# Patient Record
Sex: Male | Born: 1990 | Race: Black or African American | Hispanic: No | Marital: Married | Smoking: Current every day smoker
Health system: Southern US, Community
[De-identification: ages and names within clinical notes are randomized; demographics above are authoritative.]

## PROBLEM LIST (undated history)

## (undated) ENCOUNTER — Emergency Department (HOSPITAL_COMMUNITY)
Admission: EM | Discharge status: Absent without leave | Payer: No Typology Code available for payment source | Source: Home / Self Care

---

## 2017-06-01 ENCOUNTER — Emergency Department (HOSPITAL_COMMUNITY)
Admission: EM | Admit: 2017-06-01 | Discharge: 2017-06-01 | Disposition: A | Discharge status: Home / Self Care | Payer: Self-pay | Attending: Emergency Medicine | Admitting: Emergency Medicine

## 2017-06-01 ENCOUNTER — Encounter (HOSPITAL_COMMUNITY): Payer: Self-pay | Admitting: Emergency Medicine

## 2017-06-01 DIAGNOSIS — F17210 Nicotine dependence, cigarettes, uncomplicated: Secondary | ICD-10-CM | POA: Insufficient documentation

## 2017-06-01 DIAGNOSIS — Z202 Contact with and (suspected) exposure to infections with a predominantly sexual mode of transmission: Secondary | ICD-10-CM | POA: Insufficient documentation

## 2017-06-01 LAB — URINALYSIS, ROUTINE W REFLEX MICROSCOPIC
BILIRUBIN URINE: NEGATIVE
Glucose, UA: NEGATIVE mg/dL
HGB URINE DIPSTICK: NEGATIVE
KETONES UR: NEGATIVE mg/dL
Nitrite: NEGATIVE
PH: 5 (ref 5.0–8.0)
Protein, ur: 30 mg/dL — AB
SPECIFIC GRAVITY, URINE: 1.03 (ref 1.005–1.030)

## 2017-06-01 LAB — GC/CHLAMYDIA PROBE AMP (~~LOC~~) NOT AT ARMC
Chlamydia: NEGATIVE
NEISSERIA GONORRHEA: NEGATIVE

## 2017-06-01 LAB — RPR: RPR Ser Ql: NONREACTIVE

## 2017-06-01 LAB — HIV ANTIBODY (ROUTINE TESTING W REFLEX): HIV Screen 4th Generation wRfx: NONREACTIVE

## 2017-06-01 MED ORDER — METRONIDAZOLE 500 MG PO TABS
2000.0000 mg | ORAL_TABLET | Freq: Once | ORAL | Status: AC
Start: 2017-06-01 — End: 2017-06-01
  Administered 2017-06-01: 2000 mg via ORAL
  Filled 2017-06-01: qty 4

## 2017-06-01 MED ORDER — AZITHROMYCIN 250 MG PO TABS
1000.0000 mg | ORAL_TABLET | Freq: Once | ORAL | Status: AC
  Administered 2017-06-01: 1000 mg via ORAL
  Filled 2017-06-01: qty 4

## 2017-06-01 MED ORDER — CEFTRIAXONE SODIUM 250 MG IJ SOLR
250.0000 mg | Freq: Once | INTRAMUSCULAR | Status: AC
  Administered 2017-06-01: 250 mg via INTRAMUSCULAR
  Filled 2017-06-01: qty 250

## 2017-06-01 MED ORDER — LIDOCAINE HCL (PF) 1 % IJ SOLN
5.0000 mL | Freq: Once | INTRAMUSCULAR | Status: AC
  Administered 2017-06-01: 5 mL
  Filled 2017-06-01: qty 5

## 2017-06-01 NOTE — Discharge Instructions (Signed)
You have been tested for STDs. Some of these results are still pending. Any abnormalities will be called to you. You have been prophylactically treated for gonorrhea, Chlamydia, and Trichomonas. This does not mean you necessarily have these diseases, treatment is precautionary. Be sure to follow safe sex practices, including monogamy and/or condom use. For the treatment to be fully effective, avoid all sexual contact for at least 2 weeks after medication administration.

## 2017-06-01 NOTE — ED Triage Notes (Signed)
Pt states his wife tested positive for trich. Pt c/o penile discharge.

## 2017-06-01 NOTE — ED Provider Notes (Signed)
[facility]-EMERGENCY DEPT Provider Note   CSN: [id number] Arrival date & time: 06/01/17  0028     History   Chief Complaint Chief Complaint  Patient presents with  . Exposure to STD    HPI Guy Barajas is a 26 y.o. male.  HPI   Guy Barajas is a 26 y.o. male, patient with no pertinent past medical history, presenting to the ED stating that his wife tested positive for Trichomonas about a week ago. Patient's wife was treated at that time. Patient was not even forthcoming about this information without some prodding. Denies abdominal pain, N/V, pain with BM, penile discharge, urinary complaints, fever/chills, or any other complaints.   Patient was rather aloof and would not answer questions about sexual history or really any other questions other than the answers shown here.  History reviewed. No pertinent past medical history.  There are no active problems to display for this patient.   History reviewed. No pertinent surgical history.     Home Medications    Prior to Admission medications   Not on File    Family History No family history on file.  Social History Social History  Substance Use Topics  . Smoking status: Current Every Day Smoker    Types: Cigarettes  . Smokeless tobacco: Never Used  . Alcohol use No     Allergies   Shrimp [shellfish allergy]   Review of Systems Review of Systems  Constitutional: Negative for chills and fever.  Gastrointestinal: Negative for abdominal pain, nausea and vomiting.  Genitourinary: Negative for difficulty urinating, discharge, dysuria, hematuria, scrotal swelling and testicular pain.       Exposure to Trichomonas  All other systems reviewed and are negative.    Physical Exam Updated Vital Signs BP 115/60 (BP Location: Left Arm)   Pulse 60   Temp 99.1 F (37.3 C) (Oral)   Resp 18   Ht 5\' 9"  (1.753 m)   Wt 77.1 kg (170 lb)   SpO2 100%   BMI 25.10 kg/m   Physical Exam  Constitutional: He appears  well-developed and well-nourished. No distress.  HENT:  Head: Normocephalic and atraumatic.  Eyes: Conjunctivae are normal.  Neck: Neck supple.  Cardiovascular: Normal rate and regular rhythm.   Pulmonary/Chest: Effort normal.  Abdominal: Soft. There is no tenderness.  Genitourinary:  Genitourinary Comments: Penis, scrotum, and testicles without swelling, lesions, or tenderness. No penile discharge. Cremasteric reflex intact. Overall normal male genitalia. RN, Elliot GurneyWoody, served as Biomedical engineerchaperone during the exam.   Lymphadenopathy:       Right: No inguinal adenopathy present.       Left: No inguinal adenopathy present.  Neurological: He is alert.  Skin: Skin is warm and dry. He is not diaphoretic.  Psychiatric: He has a normal mood and affect. His behavior is normal.  Nursing note and vitals reviewed.    ED Treatments / Results  Labs (all labs ordered are listed, but only abnormal results are displayed) Labs Reviewed  URINALYSIS, ROUTINE W REFLEX MICROSCOPIC - Abnormal; Notable for the following:       Result Value   APPearance HAZY (*)    Protein, ur 30 (*)    Leukocytes, UA TRACE (*)    Bacteria, UA RARE (*)    Squamous Epithelial / LPF 0-5 (*)    All other components within normal limits  RPR  HIV ANTIBODY (ROUTINE TESTING)  GC/[facility]    EKG  EKG Interpretation None  Radiology No results found.  Procedures Procedures (including critical care time)  Medications Ordered in ED Medications  cefTRIAXone (ROCEPHIN) injection 250 mg (250 mg Intramuscular Given 06/01/17 0139)  azithromycin (ZITHROMAX) tablet 1,000 mg (1,000 mg Oral Given 06/01/17 0139)  metroNIDAZOLE (FLAGYL) tablet 2,000 mg (2,000 mg Oral Given 06/01/17 0140)  lidocaine (PF) (XYLOCAINE) 1 % injection 5 mL (5 mLs Other Given 06/01/17 0139)     Initial Impression / Assessment and Plan / ED Course  I have reviewed the triage vital signs and the nursing notes.  Pertinent  labs & imaging results that were available during my care of the patient were reviewed by me and considered in my medical decision making (see chart for details).     Patient presents for evaluation following exposure to Trichomonas. Treatment for Trichomonas, gonorrhea, Chlamydia initiated. Follow up and return precautions discussed.     Final Clinical Impressions(s) / ED Diagnoses   Final diagnoses:  STD exposure    New Prescriptions There are no discharge medications for this patient.    Anselm PancoastJoy, Chaley Castellanos C, PA-C 06/01/17 [id number]    Ward, Layla MawKristen N, DO 06/01/17 [phone]

## 2018-09-13 ENCOUNTER — Emergency Department (HOSPITAL_COMMUNITY): Payer: No Typology Code available for payment source

## 2018-09-13 ENCOUNTER — Other Ambulatory Visit: Payer: Self-pay

## 2018-09-13 ENCOUNTER — Emergency Department (HOSPITAL_COMMUNITY)
Admission: EM | Admit: 2018-09-13 | Discharge: 2018-09-13 | Disposition: A | Discharge status: Home / Self Care | Payer: No Typology Code available for payment source | Attending: Emergency Medicine | Admitting: Emergency Medicine

## 2018-09-13 ENCOUNTER — Encounter (HOSPITAL_COMMUNITY): Payer: Self-pay

## 2018-09-13 DIAGNOSIS — M791 Myalgia, unspecified site: Secondary | ICD-10-CM | POA: Insufficient documentation

## 2018-09-13 DIAGNOSIS — M7918 Myalgia, other site: Secondary | ICD-10-CM

## 2018-09-13 DIAGNOSIS — R55 Syncope and collapse: Secondary | ICD-10-CM | POA: Diagnosis present

## 2018-09-13 DIAGNOSIS — F17210 Nicotine dependence, cigarettes, uncomplicated: Secondary | ICD-10-CM | POA: Insufficient documentation

## 2018-09-13 MED ORDER — METHOCARBAMOL 500 MG PO TABS: 500.0000 mg | ORAL_TABLET | Freq: Two times a day (BID) | ORAL | 0 refills | Status: AC

## 2018-09-13 MED ORDER — OXYCODONE-ACETAMINOPHEN 5-325 MG PO TABS
1.0000 | ORAL_TABLET | Freq: Once | ORAL | Status: AC
  Administered 2018-09-13: 1 via ORAL
  Filled 2018-09-13: qty 1
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
## 2018-09-13 NOTE — ED Notes (Signed)
Patient transported to CT 

## 2018-09-13 NOTE — ED Notes (Signed)
Patient verbalizes understanding of discharge instructions. Opportunity for questioning and answers were provided. Armband removed by staff, pt discharged from ED.  

## 2018-09-13 NOTE — Discharge Instructions (Addendum)
Please read attached information. If you experience any new or worsening signs or symptoms please return to the emergency room for evaluation. Please follow-up with your primary care provider or specialist as discussed. Please use medication prescribed only as directed and discontinue taking if you have any concerning signs or symptoms.   °

## 2018-11-21 ENCOUNTER — Encounter (HOSPITAL_COMMUNITY): Payer: Self-pay | Admitting: *Deleted

## 2018-11-21 ENCOUNTER — Other Ambulatory Visit: Payer: Self-pay

## 2018-11-21 ENCOUNTER — Emergency Department (HOSPITAL_COMMUNITY)
Admission: EM | Admit: 2018-11-21 | Discharge: 2018-11-21 | Disposition: A | Discharge status: Home / Self Care | Payer: Self-pay | Attending: Emergency Medicine | Admitting: Emergency Medicine

## 2018-11-21 DIAGNOSIS — R369 Urethral discharge, unspecified: Secondary | ICD-10-CM | POA: Insufficient documentation

## 2018-11-21 DIAGNOSIS — F17210 Nicotine dependence, cigarettes, uncomplicated: Secondary | ICD-10-CM | POA: Insufficient documentation

## 2018-11-21 LAB — URINALYSIS, ROUTINE W REFLEX MICROSCOPIC
Bilirubin Urine: NEGATIVE
GLUCOSE, UA: NEGATIVE mg/dL
HGB URINE DIPSTICK: NEGATIVE
Ketones, ur: NEGATIVE mg/dL
NITRITE: NEGATIVE
PH: 5 (ref 5.0–8.0)
PROTEIN: NEGATIVE mg/dL
Specific Gravity, Urine: 1.021 (ref 1.005–1.030)

## 2018-11-21 MED ORDER — AZITHROMYCIN 250 MG PO TABS
1000.0000 mg | ORAL_TABLET | Freq: Once | ORAL | Status: AC
  Administered 2018-11-21: 1000 mg via ORAL
  Filled 2018-11-21: qty 4

## 2018-11-21 MED ORDER — CEFTRIAXONE SODIUM 250 MG IJ SOLR
250.0000 mg | Freq: Once | INTRAMUSCULAR | Status: AC
  Administered 2018-11-21: 250 mg via INTRAMUSCULAR
  Filled 2018-11-21: qty 250

## 2018-11-21 MED ORDER — STERILE WATER FOR INJECTION IJ SOLN
INTRAMUSCULAR | Status: AC
  Administered 2018-11-21: 1 mL
  Filled 2018-11-21: qty 10

## 2018-11-21 NOTE — ED Triage Notes (Signed)
Pt reports white penile discharge and pain with urination for about 3 days. Pt reports his partner known to be Chlamydia +

## 2018-11-21 NOTE — ED Notes (Signed)
Patient verbalizes understanding of medications and discharge instructions. No further questions at this time. VSS and patient ambulatory at discharge.   

## 2018-11-21 NOTE — ED Provider Notes (Signed)
[facility] EMERGENCY DEPARTMENT Provider Note   CSN: [id number] Arrival date & time: 11/21/18  0150     History   Chief Complaint Chief Complaint  Patient presents with  . Penile Discharge    HPI Guy Barajas is a 28 y.o. male.  Patient presents to the emergency department with a chief complaint of penile discharge.  States symptoms started 2 days ago.  He reports that a sexual partner was diagnosed with chlamydia.  Denies any pain with urination.  Denies any masses or lesions on or about his penis.  Denies any abdominal pain, nausea, vomiting, or fever.  Denies any treatments prior to arrival.  The history is provided by the patient. No language interpreter was used.    History reviewed. No pertinent past medical history.  There are no active problems to display for this patient.   History reviewed. No pertinent surgical history.      Home Medications    Prior to Admission medications   Medication Sig Start Date End Date Taking? Authorizing Provider  methocarbamol (ROBAXIN) 500 MG tablet Take 1 tablet (500 mg total) by mouth 2 (two) times daily. 09/13/18   Eyvonne Mechanic, PA-C    Family History No family history on file.  Social History Social History   Tobacco Use  . Smoking status: Current Every Day Smoker    Types: Cigarettes  . Smokeless tobacco: Never Used  Substance Use Topics  . Alcohol use: No  . Drug use: Yes    Types: Marijuana     Allergies   Shrimp [shellfish allergy]   Review of Systems Review of Systems  All other systems reviewed and are negative.    Physical Exam Updated Vital Signs BP 132/77 (BP Location: Right Arm)   Pulse 73   Temp 97.9 F (36.6 C) (Oral)   Resp 16   SpO2 96%   Physical Exam Vitals signs and nursing note reviewed.  Constitutional:      General: He is not in acute distress.    Appearance: He is not diaphoretic.  HENT:     Head: Normocephalic and atraumatic.  Eyes:   Conjunctiva/sclera: Conjunctivae normal.     Pupils: Pupils are equal, round, and reactive to light.  Neck:     Trachea: No tracheal deviation.  Cardiovascular:     Rate and Rhythm: Normal rate.  Pulmonary:     Effort: Pulmonary effort is normal. No respiratory distress.  Abdominal:     General: There is no distension.  Musculoskeletal: Normal range of motion.  Skin:    General: Skin is warm and dry.  Neurological:     Mental Status: He is alert and oriented to person, place, and time.  Psychiatric:        Judgment: Judgment normal.      ED Treatments / Results  Labs (all labs ordered are listed, but only abnormal results are displayed) Labs Reviewed  URINALYSIS, ROUTINE W REFLEX MICROSCOPIC - Abnormal; Notable for the following components:      Result Value   Leukocytes, UA SMALL (*)    Bacteria, UA RARE (*)    All other components within normal limits    EKG None  Radiology No results found.  Procedures Procedures (including critical care time)  Medications Ordered in ED Medications  cefTRIAXone (ROCEPHIN) injection 250 mg (has no administration in time range)  azithromycin (ZITHROMAX) tablet 1,000 mg (has no administration in time range)     Initial Impression / Assessment and Plan /  ED Course  I have reviewed the triage vital signs and the nursing notes.  Pertinent labs & imaging results that were available during my care of the patient were reviewed by me and considered in my medical decision making (see chart for details).     Patient with penile discharge.  Sexual partner positive for chlamydia.  Will treat.  Final Clinical Impressions(s) / ED Diagnoses   Final diagnoses:  Penile discharge    ED Discharge Orders    None       Roxy Horseman, PA-C 11/21/18 0247    Dione Booze, MD 11/21/18 [phone]
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
